# Patient Record
Sex: Male | Born: 1970
Health system: Southern US, Community
[De-identification: ages and names within clinical notes are randomized; demographics above are authoritative.]

## PROBLEM LIST (undated history)

## (undated) DIAGNOSIS — S060XAA Concussion with loss of consciousness status unknown, initial encounter: Secondary | ICD-10-CM

## (undated) DIAGNOSIS — K5190 Ulcerative colitis, unspecified, without complications: Secondary | ICD-10-CM

## (undated) DIAGNOSIS — Z87442 Personal history of urinary calculi: Secondary | ICD-10-CM

## (undated) DIAGNOSIS — L988 Other specified disorders of the skin and subcutaneous tissue: Secondary | ICD-10-CM

## (undated) DIAGNOSIS — R059 Cough, unspecified: Secondary | ICD-10-CM

## (undated) DIAGNOSIS — N390 Urinary tract infection, site not specified: Secondary | ICD-10-CM

## (undated) DIAGNOSIS — K219 Gastro-esophageal reflux disease without esophagitis: Secondary | ICD-10-CM

## (undated) HISTORY — PX: SHOULDER SURGERY: SHX246

## (undated) HISTORY — PX: KNEE SURGERY: SHX244

---

## 1999-06-06 ENCOUNTER — Ambulatory Visit (HOSPITAL_BASED_OUTPATIENT_CLINIC_OR_DEPARTMENT_OTHER): Admission: RE | Admit: 1999-06-06 | Discharge: 1999-06-06 | Payer: Self-pay | Admitting: Orthopedic Surgery

## 2000-07-02 ENCOUNTER — Emergency Department (HOSPITAL_COMMUNITY): Admission: EM | Admit: 2000-07-02 | Discharge: 2000-07-02 | Payer: Self-pay | Admitting: *Deleted

## 2000-07-02 ENCOUNTER — Encounter: Payer: Self-pay | Admitting: *Deleted

## 2002-11-09 ENCOUNTER — Encounter: Payer: Self-pay | Admitting: Family Medicine

## 2002-11-09 ENCOUNTER — Encounter: Admission: RE | Admit: 2002-11-09 | Discharge: 2002-11-09 | Payer: Self-pay | Admitting: Family Medicine

## 2003-09-20 ENCOUNTER — Encounter: Admission: RE | Admit: 2003-09-20 | Discharge: 2003-09-20 | Payer: Self-pay | Admitting: Family Medicine

## 2004-12-11 ENCOUNTER — Emergency Department (HOSPITAL_COMMUNITY): Admission: EM | Admit: 2004-12-11 | Discharge: 2004-12-11 | Payer: Self-pay | Admitting: Emergency Medicine

## 2007-11-15 ENCOUNTER — Encounter: Admission: RE | Admit: 2007-11-15 | Discharge: 2007-11-15 | Payer: Self-pay | Admitting: Family Medicine

## 2008-08-14 ENCOUNTER — Encounter: Admission: RE | Admit: 2008-08-14 | Discharge: 2008-08-14 | Payer: Self-pay | Admitting: Family Medicine

## 2010-02-15 ENCOUNTER — Encounter: Admission: RE | Admit: 2010-02-15 | Discharge: 2010-02-15 | Payer: Self-pay | Admitting: Family Medicine

## 2013-09-01 ENCOUNTER — Encounter (HOSPITAL_BASED_OUTPATIENT_CLINIC_OR_DEPARTMENT_OTHER): Payer: Self-pay | Admitting: Emergency Medicine

## 2013-09-01 ENCOUNTER — Emergency Department (HOSPITAL_BASED_OUTPATIENT_CLINIC_OR_DEPARTMENT_OTHER)
Admission: EM | Admit: 2013-09-01 | Discharge: 2013-09-01 | Disposition: A | Discharge status: Home / Self Care | Payer: 59 | Attending: Emergency Medicine | Admitting: Emergency Medicine

## 2013-09-01 DIAGNOSIS — B9789 Other viral agents as the cause of diseases classified elsewhere: Secondary | ICD-10-CM | POA: Insufficient documentation

## 2013-09-01 DIAGNOSIS — N390 Urinary tract infection, site not specified: Secondary | ICD-10-CM

## 2013-09-01 DIAGNOSIS — B349 Viral infection, unspecified: Secondary | ICD-10-CM

## 2013-09-01 DIAGNOSIS — R5381 Other malaise: Secondary | ICD-10-CM | POA: Insufficient documentation

## 2013-09-01 DIAGNOSIS — R5383 Other fatigue: Secondary | ICD-10-CM

## 2013-09-01 DIAGNOSIS — J3489 Other specified disorders of nose and nasal sinuses: Secondary | ICD-10-CM | POA: Insufficient documentation

## 2013-09-01 LAB — URINE MICROSCOPIC-ADD ON

## 2013-09-01 LAB — URINALYSIS, ROUTINE W REFLEX MICROSCOPIC
Bilirubin Urine: NEGATIVE
GLUCOSE, UA: NEGATIVE mg/dL
HGB URINE DIPSTICK: NEGATIVE
Ketones, ur: 15 mg/dL — AB
Nitrite: NEGATIVE
PH: 6.5 (ref 5.0–8.0)
PROTEIN: NEGATIVE mg/dL
SPECIFIC GRAVITY, URINE: 1.033 — AB (ref 1.005–1.030)
Urobilinogen, UA: 1 mg/dL (ref 0.0–1.0)

## 2013-09-01 MED ORDER — CIPROFLOXACIN HCL 500 MG PO TABS
500.0000 mg | ORAL_TABLET | Freq: Two times a day (BID) | ORAL | Status: DC
Start: 2013-09-01 — End: 2017-11-25

## 2013-09-01 MED ORDER — IBUPROFEN 600 MG PO TABS: 600.0000 mg | ORAL_TABLET | Freq: Four times a day (QID) | ORAL | Status: DC | PRN

## 2013-09-01 NOTE — ED Notes (Signed)
Pt reports fever and back & body aches since Monday- concerned because this is how his prostatitis usually presents vs flu

## 2013-09-01 NOTE — ED Notes (Signed)
Rx x 2 given for cipro and ibuprofen.

## 2013-09-01 NOTE — ED Provider Notes (Signed)
CSN: [id number]     Arrival date & time 09/01/13  0205 History   First MD Initiated Contact with Patient 09/01/13 0255     Chief Complaint  Patient presents with  . Fever   (Consider location/radiation/quality/duration/timing/severity/associated sxs/prior Treatment) Patient is a 43 y.o. male presenting with fever. The history is provided by the patient.  Fever Max temp prior to arrival:  100 Severity:  Moderate Onset quality:  Gradual Duration:  5 days Timing:  Intermittent Progression:  Unchanged Chronicity:  New Relieved by:  Nothing Worsened by:  Nothing tried Associated symptoms: congestion and myalgias   Associated symptoms: no chest pain, no cough and no diarrhea   Risk factors: no hx of cancer   Also feels like he has incomplete emptying of the bladder.  Not painful like prostatis but he is concerned   History reviewed. No pertinent past medical history. Past Surgical History  Procedure Laterality Date  . Knee surgery    . Shoulder surgery     No family history on file. History  Substance Use Topics  . Smoking status: Never Smoker   . Smokeless tobacco: Never Used  . Alcohol Use: No    Review of Systems  Constitutional: Positive for fever.  HENT: Positive for congestion.   Respiratory: Negative for cough.   Cardiovascular: Negative for chest pain.  Gastrointestinal: Negative for diarrhea.  Musculoskeletal: Positive for myalgias.  All other systems reviewed and are negative.    Allergies  Review of patient's allergies indicates no known allergies.  Home Medications   Current Outpatient Rx  Name  Route  Sig  Dispense  Refill  . ibuprofen (ADVIL,MOTRIN) 200 MG tablet   Oral   Take 600 mg by mouth every 6 (six) hours as needed.          BP 138/77  Pulse 90  Temp(Src) 98.1 F (36.7 C) (Oral)  Resp 18  Ht 6\' 3"  (1.905 m)  Wt 230 lb (104.327 kg)  BMI 28.75 kg/m2  SpO2 97% Physical Exam  Constitutional: He is oriented to person, place, and time.  He appears well-developed and well-nourished. No distress.  HENT:  Head: Normocephalic and atraumatic.  Mouth/Throat: Oropharynx is clear and moist. No oropharyngeal exudate.  Eyes: Conjunctivae are normal. Pupils are equal, round, and reactive to light.  Neck: Normal range of motion. Neck supple.  Cardiovascular: Normal rate, regular rhythm and intact distal pulses.   Pulmonary/Chest: Effort normal and breath sounds normal. He has no wheezes. He has no rales.  Abdominal: Soft. Bowel sounds are normal. There is no tenderness. There is no rebound and no guarding.  Musculoskeletal: Normal range of motion.  Lymphadenopathy:    He has no cervical adenopathy.  Neurological: He is alert and oriented to person, place, and time. He has normal reflexes.  Skin: Skin is warm and dry.  Psychiatric: He has a normal mood and affect.    ED Course  Procedures (including critical care time) Labs Review Labs Reviewed  URINALYSIS, ROUTINE W REFLEX MICROSCOPIC - Abnormal; Notable for the following:    Specific Gravity, Urine 1.033 (*)    Ketones, ur 15 (*)    Leukocytes, UA SMALL (*)    All other components within normal limits  URINE MICROSCOPIC-ADD ON - Abnormal; Notable for the following:    Bacteria, UA FEW (*)    All other components within normal limits  URINE CULTURE   Imaging Review No results found.  EKG Interpretation   None  MDM  No diagnosis found. Viral syndrome and will treat for UTI follow up with urology   Malashia Kamaka K Melesa Lecy-Rasch, MD 09/01/13 [phone]

## 2013-09-03 LAB — URINE CULTURE: Colony Count: >=100000

## 2013-09-04 ENCOUNTER — Telehealth (HOSPITAL_COMMUNITY): Payer: Self-pay | Admitting: Emergency Medicine

## 2013-09-04 NOTE — ED Notes (Signed)
Post ED Visit - Positive Culture Follow-up  Culture report reviewed by antimicrobial stewardship pharmacist: [x] Wes Dulaney, Pharm.D., BCPS [] Jeremy Frens, Pharm.D., BCPS [] Elizabeth Martin, Pharm.D., BCPS [] Minh Pham, Pharm.D., BCPS, AAHIVP [] Michelle Turner, Pharm.D., BCPS, AAHIVP  Positive urine culture Treated with Cipro, organism sensitive to the same and no further patient follow-up is required at this time.  Andrew Adkins 09/04/2013, 6:01 PM   

## 2015-09-02 HISTORY — PX: OTHER SURGICAL HISTORY: SHX169

## 2016-09-26 DIAGNOSIS — G4730 Sleep apnea, unspecified: Secondary | ICD-10-CM | POA: Diagnosis not present

## 2016-09-29 DIAGNOSIS — G4733 Obstructive sleep apnea (adult) (pediatric): Secondary | ICD-10-CM | POA: Diagnosis not present

## 2016-12-23 DIAGNOSIS — K625 Hemorrhage of anus and rectum: Secondary | ICD-10-CM | POA: Diagnosis not present

## 2017-01-02 DIAGNOSIS — K5289 Other specified noninfective gastroenteritis and colitis: Secondary | ICD-10-CM | POA: Diagnosis not present

## 2017-01-02 DIAGNOSIS — K529 Noninfective gastroenteritis and colitis, unspecified: Secondary | ICD-10-CM | POA: Diagnosis not present

## 2017-01-02 DIAGNOSIS — K625 Hemorrhage of anus and rectum: Secondary | ICD-10-CM | POA: Diagnosis not present

## 2017-01-09 DIAGNOSIS — R197 Diarrhea, unspecified: Secondary | ICD-10-CM | POA: Diagnosis not present

## 2017-01-28 DIAGNOSIS — K5100 Ulcerative (chronic) pancolitis without complications: Secondary | ICD-10-CM | POA: Diagnosis not present

## 2017-02-26 DIAGNOSIS — K5100 Ulcerative (chronic) pancolitis without complications: Secondary | ICD-10-CM | POA: Diagnosis not present

## 2017-05-13 DIAGNOSIS — Z23 Encounter for immunization: Secondary | ICD-10-CM | POA: Diagnosis not present

## 2017-08-03 DIAGNOSIS — L821 Other seborrheic keratosis: Secondary | ICD-10-CM | POA: Diagnosis not present

## 2017-08-03 DIAGNOSIS — L918 Other hypertrophic disorders of the skin: Secondary | ICD-10-CM | POA: Diagnosis not present

## 2017-08-03 DIAGNOSIS — Z86018 Personal history of other benign neoplasm: Secondary | ICD-10-CM | POA: Diagnosis not present

## 2017-09-07 DIAGNOSIS — K5100 Ulcerative (chronic) pancolitis without complications: Secondary | ICD-10-CM | POA: Diagnosis not present

## 2017-11-23 DIAGNOSIS — R509 Fever, unspecified: Secondary | ICD-10-CM | POA: Diagnosis not present

## 2017-11-24 DIAGNOSIS — N390 Urinary tract infection, site not specified: Secondary | ICD-10-CM | POA: Diagnosis not present

## 2017-11-24 DIAGNOSIS — B9620 Unspecified Escherichia coli [E. coli] as the cause of diseases classified elsewhere: Secondary | ICD-10-CM | POA: Diagnosis not present

## 2017-11-24 DIAGNOSIS — N410 Acute prostatitis: Secondary | ICD-10-CM | POA: Diagnosis not present

## 2017-11-25 ENCOUNTER — Other Ambulatory Visit: Payer: Self-pay

## 2017-11-25 ENCOUNTER — Emergency Department (HOSPITAL_COMMUNITY)
Admission: EM | Admit: 2017-11-25 | Discharge: 2017-11-25 | Disposition: A | Discharge status: Home / Self Care | Payer: 59 | Attending: Physician Assistant | Admitting: Physician Assistant

## 2017-11-25 ENCOUNTER — Encounter (HOSPITAL_COMMUNITY): Payer: Self-pay | Admitting: Emergency Medicine

## 2017-11-25 DIAGNOSIS — R339 Retention of urine, unspecified: Secondary | ICD-10-CM | POA: Diagnosis not present

## 2017-11-25 DIAGNOSIS — Z79899 Other long term (current) drug therapy: Secondary | ICD-10-CM | POA: Insufficient documentation

## 2017-11-25 HISTORY — DX: Ulcerative colitis, unspecified, without complications: K51.90

## 2017-11-25 HISTORY — DX: Urinary tract infection, site not specified: N39.0

## 2017-11-25 LAB — COMPREHENSIVE METABOLIC PANEL
ALK PHOS: 100 U/L (ref 38–126)
ALT: 38 U/L (ref 17–63)
AST: 36 U/L (ref 15–41)
Albumin: 4.3 g/dL (ref 3.5–5.0)
Anion gap: 13 (ref 5–15)
BUN: 19 mg/dL (ref 6–20)
CHLORIDE: 97 mmol/L — AB (ref 101–111)
CO2: 24 mmol/L (ref 22–32)
CREATININE: 1.22 mg/dL (ref 0.61–1.24)
Calcium: 9.3 mg/dL (ref 8.9–10.3)
GFR calc Af Amer: >60 mL/min (ref >60)
Glucose, Bld: 108 mg/dL — ABNORMAL HIGH (ref 65–99)
Potassium: 4 mmol/L (ref 3.5–5.1)
Sodium: 134 mmol/L — ABNORMAL LOW (ref 135–145)
Total Bilirubin: 3.2 mg/dL — ABNORMAL HIGH (ref 0.3–1.2)
Total Protein: 8.3 g/dL — ABNORMAL HIGH (ref 6.5–8.1)

## 2017-11-25 LAB — CBC WITH DIFFERENTIAL/PLATELET
Basophils Absolute: 0 10*3/uL (ref 0.0–0.1)
Basophils Relative: 0 %
EOS ABS: 0 10*3/uL (ref 0.0–0.7)
Eosinophils Relative: 0 %
HCT: 40.8 % (ref 39.0–52.0)
Hemoglobin: 14.2 g/dL (ref 13.0–17.0)
LYMPHS ABS: 1 10*3/uL (ref 0.7–4.0)
Lymphocytes Relative: 5 %
MCH: 30.7 pg (ref 26.0–34.0)
MCHC: 34.8 g/dL (ref 30.0–36.0)
MCV: 88.1 fL (ref 78.0–100.0)
MONOS PCT: 6 %
Monocytes Absolute: 1.2 10*3/uL — ABNORMAL HIGH (ref 0.1–1.0)
Neutro Abs: 17.4 10*3/uL — ABNORMAL HIGH (ref 1.7–7.7)
Neutrophils Relative %: 89 %
PLATELETS: 229 10*3/uL (ref 150–400)
RBC: 4.63 MIL/uL (ref 4.22–5.81)
RDW: 12.2 % (ref 11.5–15.5)
WBC: 19.6 10*3/uL — AB (ref 4.0–10.5)

## 2017-11-25 LAB — URINALYSIS, ROUTINE W REFLEX MICROSCOPIC
Bilirubin Urine: NEGATIVE
GLUCOSE, UA: NEGATIVE mg/dL
Ketones, ur: 20 mg/dL — AB
Nitrite: NEGATIVE
PH: 5 (ref 5.0–8.0)
PROTEIN: 30 mg/dL — AB
SPECIFIC GRAVITY, URINE: 1.028 (ref 1.005–1.030)

## 2017-11-25 NOTE — ED Triage Notes (Signed)
Pt recently diagnosed with UTI; retention throughout the night.

## 2017-11-25 NOTE — ED Provider Notes (Signed)
[facility]-EMERGENCY DEPT Provider Note   CSN: [id number] Arrival date & time: 11/25/17  2956     History   Chief Complaint Chief Complaint  Patient presents with  . Urinary Retention    HPI Andrew Adkins is a 47 y.o. male.  HPI   47 year old male presenting with acute urinary retention.  3 days ago patient had some fevers and chills.  2 days ago seen in his primary care and tested for the flu which is negative.  He then began to have blood in his urine so he made an appointment [facility].  He was seen there yesterday.  Found to have a urinary tract infection, given ceftriaxone and Cipro for home.  Patient had ultrasound of his kidneys at that time showing no evidence of stones.  Last night he has not urinated since 9 PM.  Bladder scan shows over 400.  Patient was acutely uncomfortable on arrival.  Past Medical History:  Diagnosis Date  . Ulcerative colitis (HCC)   . UTI (urinary tract infection)     There are no active problems to display for this patient.   Past Surgical History:  Procedure Laterality Date  . KNEE SURGERY    . SHOULDER SURGERY          Home Medications    Prior to Admission medications   Medication Sig Start Date End Date Taking? Authorizing Provider  acetaminophen (TYLENOL) 500 MG tablet Take 1,000 mg by mouth every 6 (six) hours as needed for mild pain.   Yes [provider]  APRISO 0.375 g 24 hr capsule Take 0.375 g by mouth 3 (three) times daily.  09/03/17  Yes [provider]  ciprofloxacin (CIPRO) 500 MG tablet Take 500 mg by mouth 2 (two) times daily.   Yes [provider]  ibuprofen (ADVIL,MOTRIN) 200 MG tablet Take 400 mg by mouth every 6 (six) hours as needed for moderate pain.    Yes [provider]  oseltamivir (TAMIFLU) 75 MG capsule Take 75 mg by mouth 2 (two) times daily. 11/23/17  Yes [provider]    Family History No family history on file.  Social  History Social History   Tobacco Use  . Smoking status: Never Smoker  . Smokeless tobacco: Never Used  Substance Use Topics  . Alcohol use: No  . Drug use: No     Allergies   Patient has no known allergies.   Review of Systems Review of Systems  Constitutional: Negative for activity change, fatigue and fever.  Respiratory: Negative for shortness of breath.   Cardiovascular: Negative for chest pain.  Gastrointestinal: Negative for abdominal pain.  Genitourinary: Positive for decreased urine volume, difficulty urinating and flank pain.  All other systems reviewed and are negative.    Physical Exam Updated Vital Signs BP 129/79 (BP Location: Left Arm)   Pulse 90   Temp 98.1 F (36.7 C) (Oral)   Resp 20   SpO2 100%   Physical Exam  Constitutional: He is oriented to person, place, and time. He appears well-nourished.  HENT:  Head: Normocephalic.  Eyes: Conjunctivae are normal. Right eye exhibits no discharge. Left eye exhibits no discharge.  Cardiovascular: Normal rate and regular rhythm.  No murmur heard. Pulmonary/Chest: Effort normal and breath sounds normal. No respiratory distress.  Abdominal: Soft. He exhibits no distension. There is no tenderness.  Genitourinary:  Genitourinary Comments: Foley in place  Neurological: He is oriented to person, place, and time.  Skin: Skin  is warm and dry. He is not diaphoretic.  Psychiatric: He has a normal mood and affect. His behavior is normal.     ED Treatments / Results  Labs (all labs ordered are listed, but only abnormal results are displayed) Labs Reviewed  URINE CULTURE  URINALYSIS, ROUTINE W REFLEX MICROSCOPIC  COMPREHENSIVE METABOLIC PANEL  CBC WITH DIFFERENTIAL/PLATELET    EKG None  Radiology No results found.  Procedures Procedures (including critical care time)  Medications Ordered in ED Medications - No data to display   Initial Impression / Assessment and Plan / ED Course  I have reviewed  the triage vital signs and the nursing notes.  Pertinent labs & imaging results that were available during my care of the patient were reviewed by me and considered in my medical decision making (see chart for details).     47 year old male presenting with acute urinary retention.  3 days ago patient had some fevers and chills.  2 days ago seen in his primary care and tested for the flu which is negative.  He then began to have blood in his urine so he made an appointment [facility].  He was seen there yesterday.  Found to have a urinary tract infection, given ceftriaxone and Cipro for home.  Patient had ultrasound of his kidneys at that time showing no evidence of stones.  Last night he has not urinated since 9 PM.  Bladder scan shows over 400.  Patient was acutely uncomfortable on arrival.  8:40 AM Given that she had recently normal ultrasound, no need to repeat.  Will get blood work to make sure patient has normal kidney function.  Patient has normal vital signs.  Appears comfortable now.  We will plan to keep Foley for 7-10 days and follow-up with urology.  Final Clinical Impressions(s) / ED Diagnoses   Final diagnoses:  None    ED Discharge Orders    None       Maxwell Martorano, Cindee Saltourteney Lyn, MD 11/25/17 1551

## 2017-11-25 NOTE — Discharge Instructions (Addendum)
Please continue to take the antibiotics prescribed by your urologist.  Please follow-up with urology if not improving.  We anticipate that you will start feeling better in the next 24 hours.  We have placed a Foley that will require to be there for 7-10 days.  Please use a leg bag, and to follow-up with urology as planned.

## 2017-11-26 LAB — URINE CULTURE: CULTURE: NO GROWTH

## 2017-12-02 DIAGNOSIS — N410 Acute prostatitis: Secondary | ICD-10-CM | POA: Diagnosis not present

## 2017-12-02 DIAGNOSIS — R3915 Urgency of urination: Secondary | ICD-10-CM | POA: Diagnosis not present

## 2018-01-04 DIAGNOSIS — Z125 Encounter for screening for malignant neoplasm of prostate: Secondary | ICD-10-CM | POA: Diagnosis not present

## 2018-01-04 DIAGNOSIS — E782 Mixed hyperlipidemia: Secondary | ICD-10-CM | POA: Diagnosis not present

## 2018-01-04 DIAGNOSIS — Z87440 Personal history of urinary (tract) infections: Secondary | ICD-10-CM | POA: Diagnosis not present

## 2018-01-04 DIAGNOSIS — Z0000 Encounter for general adult medical examination without abnormal findings: Secondary | ICD-10-CM | POA: Diagnosis not present

## 2018-01-04 DIAGNOSIS — K5100 Ulcerative (chronic) pancolitis without complications: Secondary | ICD-10-CM | POA: Diagnosis not present

## 2018-01-04 DIAGNOSIS — K219 Gastro-esophageal reflux disease without esophagitis: Secondary | ICD-10-CM | POA: Diagnosis not present

## 2018-02-02 DIAGNOSIS — R972 Elevated prostate specific antigen [PSA]: Secondary | ICD-10-CM | POA: Diagnosis not present

## 2018-04-19 DIAGNOSIS — J392 Other diseases of pharynx: Secondary | ICD-10-CM | POA: Diagnosis not present

## 2018-04-19 DIAGNOSIS — Z7289 Other problems related to lifestyle: Secondary | ICD-10-CM | POA: Diagnosis not present

## 2018-04-19 DIAGNOSIS — J343 Hypertrophy of nasal turbinates: Secondary | ICD-10-CM | POA: Diagnosis not present

## 2018-04-30 DIAGNOSIS — R972 Elevated prostate specific antigen [PSA]: Secondary | ICD-10-CM | POA: Diagnosis not present

## 2018-06-16 DIAGNOSIS — Z23 Encounter for immunization: Secondary | ICD-10-CM | POA: Diagnosis not present

## 2018-08-10 DIAGNOSIS — R972 Elevated prostate specific antigen [PSA]: Secondary | ICD-10-CM | POA: Diagnosis not present

## 2018-08-23 DIAGNOSIS — R972 Elevated prostate specific antigen [PSA]: Secondary | ICD-10-CM | POA: Diagnosis not present

## 2018-08-30 DIAGNOSIS — R972 Elevated prostate specific antigen [PSA]: Secondary | ICD-10-CM | POA: Diagnosis not present

## 2018-08-30 DIAGNOSIS — N411 Chronic prostatitis: Secondary | ICD-10-CM | POA: Diagnosis not present

## 2018-08-30 DIAGNOSIS — N414 Granulomatous prostatitis: Secondary | ICD-10-CM | POA: Diagnosis not present

## 2018-10-29 DIAGNOSIS — K5100 Ulcerative (chronic) pancolitis without complications: Secondary | ICD-10-CM | POA: Diagnosis not present

## 2019-08-02 DIAGNOSIS — U071 COVID-19: Secondary | ICD-10-CM

## 2019-08-02 HISTORY — DX: COVID-19: U07.1

## 2020-12-21 DIAGNOSIS — K219 Gastro-esophageal reflux disease without esophagitis: Secondary | ICD-10-CM | POA: Diagnosis not present

## 2020-12-21 DIAGNOSIS — K5100 Ulcerative (chronic) pancolitis without complications: Secondary | ICD-10-CM | POA: Diagnosis not present

## 2021-02-07 ENCOUNTER — Ambulatory Visit
Admission: RE | Admit: 2021-02-07 | Discharge: 2021-02-07 | Disposition: A | Discharge status: Home / Self Care | Payer: BC Managed Care – PPO | Source: Ambulatory Visit | Attending: Family Medicine | Admitting: Family Medicine

## 2021-02-07 ENCOUNTER — Other Ambulatory Visit: Payer: Self-pay | Admitting: Family Medicine

## 2021-02-07 DIAGNOSIS — M25522 Pain in left elbow: Secondary | ICD-10-CM | POA: Diagnosis not present

## 2021-02-07 DIAGNOSIS — R030 Elevated blood-pressure reading, without diagnosis of hypertension: Secondary | ICD-10-CM | POA: Diagnosis not present

## 2021-02-07 DIAGNOSIS — M542 Cervicalgia: Secondary | ICD-10-CM

## 2021-04-11 DIAGNOSIS — E782 Mixed hyperlipidemia: Secondary | ICD-10-CM | POA: Diagnosis not present

## 2021-04-11 DIAGNOSIS — Z0000 Encounter for general adult medical examination without abnormal findings: Secondary | ICD-10-CM | POA: Diagnosis not present

## 2021-04-11 DIAGNOSIS — Z23 Encounter for immunization: Secondary | ICD-10-CM | POA: Diagnosis not present

## 2021-04-11 DIAGNOSIS — Z125 Encounter for screening for malignant neoplasm of prostate: Secondary | ICD-10-CM | POA: Diagnosis not present

## 2021-04-16 DIAGNOSIS — D489 Neoplasm of uncertain behavior, unspecified: Secondary | ICD-10-CM | POA: Diagnosis not present

## 2021-04-16 DIAGNOSIS — L821 Other seborrheic keratosis: Secondary | ICD-10-CM | POA: Diagnosis not present

## 2021-04-16 DIAGNOSIS — L820 Inflamed seborrheic keratosis: Secondary | ICD-10-CM | POA: Diagnosis not present

## 2021-04-16 DIAGNOSIS — D2239 Melanocytic nevi of other parts of face: Secondary | ICD-10-CM | POA: Diagnosis not present

## 2021-04-16 DIAGNOSIS — D239 Other benign neoplasm of skin, unspecified: Secondary | ICD-10-CM | POA: Diagnosis not present

## 2021-06-11 DIAGNOSIS — L089 Local infection of the skin and subcutaneous tissue, unspecified: Secondary | ICD-10-CM | POA: Diagnosis not present

## 2021-06-11 DIAGNOSIS — L723 Sebaceous cyst: Secondary | ICD-10-CM | POA: Diagnosis not present

## 2021-07-16 ENCOUNTER — Ambulatory Visit: Payer: Self-pay | Admitting: Surgery

## 2021-07-16 DIAGNOSIS — L988 Other specified disorders of the skin and subcutaneous tissue: Secondary | ICD-10-CM | POA: Diagnosis not present

## 2021-07-20 DIAGNOSIS — J111 Influenza due to unidentified influenza virus with other respiratory manifestations: Secondary | ICD-10-CM

## 2021-07-20 HISTORY — DX: Influenza due to unidentified influenza virus with other respiratory manifestations: J11.1

## 2021-07-23 ENCOUNTER — Encounter (HOSPITAL_BASED_OUTPATIENT_CLINIC_OR_DEPARTMENT_OTHER): Payer: Self-pay | Admitting: Surgery

## 2021-07-23 DIAGNOSIS — Z20828 Contact with and (suspected) exposure to other viral communicable diseases: Secondary | ICD-10-CM | POA: Diagnosis not present

## 2021-07-23 DIAGNOSIS — R509 Fever, unspecified: Secondary | ICD-10-CM | POA: Diagnosis not present

## 2021-07-23 DIAGNOSIS — J324 Chronic pansinusitis: Secondary | ICD-10-CM | POA: Diagnosis not present

## 2021-07-23 DIAGNOSIS — R0981 Nasal congestion: Secondary | ICD-10-CM | POA: Diagnosis not present

## 2021-07-29 ENCOUNTER — Encounter (HOSPITAL_BASED_OUTPATIENT_CLINIC_OR_DEPARTMENT_OTHER): Payer: Self-pay | Admitting: Surgery

## 2021-07-29 ENCOUNTER — Other Ambulatory Visit: Payer: Self-pay

## 2021-07-29 NOTE — Progress Notes (Addendum)
Spoke w/ via phone for pre-op interview---pt Lab needs dos----  none             Lab results------none COVID test -----recent flu still has cough covid test 07-30-2021 or 07-31-2021  Arrive at -------730 am 08-01-2021 NPO after MN NO Solid Food.  Clear liquids from MN until---630 am  Med rec completed Medications to take morning of surgery -----none Diabetic medication -----n/a Patient instructed to bring photo id and insurance card day of surgery Patient aware to have Driver (ride ) / caregiver    for 24 hours after surgery  wife aimee Patient Special Instructions -----none Pre-Op special Istructions -----none Patient verbalized understanding of instructions that were given at this phone interview. Patient denies shortness of breath, chest pain, fever, cough at this phone interview.   Addendum: spoke with patient by phone and he stated he is feeling much better and cough is much improved, he only coughs if he talks a lot  and it is only occasional clear mucous with cough and he wants to have anesthesia assess him in am if covid test he took this am is negative.  Spoke with dr Jacquenette Shone and pt needs to be rescheduled due to recent viral illness and still has cough, called pt and made aware to reschedule 4 to 6 weeks out per dr Jacquenette Shone anesthesia.

## 2021-07-31 ENCOUNTER — Other Ambulatory Visit: Payer: Self-pay | Admitting: Surgery

## 2021-07-31 ENCOUNTER — Encounter (HOSPITAL_BASED_OUTPATIENT_CLINIC_OR_DEPARTMENT_OTHER): Payer: Self-pay | Admitting: Anesthesiology

## 2021-07-31 ENCOUNTER — Encounter (HOSPITAL_BASED_OUTPATIENT_CLINIC_OR_DEPARTMENT_OTHER): Payer: Self-pay | Admitting: Surgery

## 2021-07-31 LAB — SARS CORONAVIRUS 2 (TAT 6-24 HRS): SARS Coronavirus 2: NEGATIVE

## 2021-07-31 NOTE — Anesthesia Preprocedure Evaluation (Deleted)
Anesthesia Evaluation    Reviewed: Allergy & Precautions, Patient's Chart, lab work & pertinent test results  Airway        Dental   Pulmonary  07/20/21 flu + s/p steroids and z pack          Cardiovascular negative cardio ROS       Neuro/Psych negative neurological ROS     GI/Hepatic Neg liver ROS, PUD, UC Pilonidal disease   Endo/Other  negative endocrine ROS  Renal/GU negative Renal ROS     Musculoskeletal negative musculoskeletal ROS (+)   Abdominal   Peds  Hematology negative hematology ROS (+)   Anesthesia Other Findings   Reproductive/Obstetrics                             Anesthesia Physical Anesthesia Plan Anesthesia Quick Evaluation

## 2021-08-01 ENCOUNTER — Ambulatory Visit (HOSPITAL_BASED_OUTPATIENT_CLINIC_OR_DEPARTMENT_OTHER): Admission: RE | Admit: 2021-08-01 | Payer: BC Managed Care – PPO | Source: Home / Self Care | Admitting: Surgery

## 2021-08-01 HISTORY — DX: Concussion with loss of consciousness status unknown, initial encounter: S06.0XAA

## 2021-08-01 HISTORY — DX: Cough, unspecified: R05.9

## 2021-08-01 HISTORY — DX: Personal history of urinary calculi: Z87.442

## 2021-08-01 HISTORY — DX: Gastro-esophageal reflux disease without esophagitis: K21.9

## 2021-08-01 HISTORY — DX: Other specified disorders of the skin and subcutaneous tissue: L98.8

## 2021-08-01 SURGERY — EXCISION, SIMPLE PILONIDAL CYST
Anesthesia: General

## 2021-08-20 DIAGNOSIS — L0591 Pilonidal cyst without abscess: Secondary | ICD-10-CM | POA: Diagnosis not present

## 2021-11-12 DIAGNOSIS — J392 Other diseases of pharynx: Secondary | ICD-10-CM | POA: Diagnosis not present

## 2022-01-28 DIAGNOSIS — K219 Gastro-esophageal reflux disease without esophagitis: Secondary | ICD-10-CM | POA: Diagnosis not present

## 2022-01-28 DIAGNOSIS — J343 Hypertrophy of nasal turbinates: Secondary | ICD-10-CM | POA: Diagnosis not present

## 2022-01-28 DIAGNOSIS — Z86018 Personal history of other benign neoplasm: Secondary | ICD-10-CM | POA: Diagnosis not present

## 2022-01-28 DIAGNOSIS — J3489 Other specified disorders of nose and nasal sinuses: Secondary | ICD-10-CM | POA: Diagnosis not present

## 2022-04-17 DIAGNOSIS — E782 Mixed hyperlipidemia: Secondary | ICD-10-CM | POA: Diagnosis not present

## 2022-04-17 DIAGNOSIS — Z125 Encounter for screening for malignant neoplasm of prostate: Secondary | ICD-10-CM | POA: Diagnosis not present

## 2022-04-17 DIAGNOSIS — Z0000 Encounter for general adult medical examination without abnormal findings: Secondary | ICD-10-CM | POA: Diagnosis not present

## 2022-04-24 DIAGNOSIS — H93291 Other abnormal auditory perceptions, right ear: Secondary | ICD-10-CM | POA: Diagnosis not present

## 2022-04-24 DIAGNOSIS — K219 Gastro-esophageal reflux disease without esophagitis: Secondary | ICD-10-CM | POA: Diagnosis not present

## 2022-04-24 DIAGNOSIS — H9311 Tinnitus, right ear: Secondary | ICD-10-CM | POA: Diagnosis not present

## 2022-09-30 DIAGNOSIS — K5289 Other specified noninfective gastroenteritis and colitis: Secondary | ICD-10-CM | POA: Diagnosis not present

## 2022-09-30 DIAGNOSIS — Z8719 Personal history of other diseases of the digestive system: Secondary | ICD-10-CM | POA: Diagnosis not present

## 2022-09-30 DIAGNOSIS — K648 Other hemorrhoids: Secondary | ICD-10-CM | POA: Diagnosis not present

## 2022-09-30 DIAGNOSIS — D123 Benign neoplasm of transverse colon: Secondary | ICD-10-CM | POA: Diagnosis not present

## 2022-09-30 DIAGNOSIS — K5100 Ulcerative (chronic) pancolitis without complications: Secondary | ICD-10-CM | POA: Diagnosis not present

## 2022-12-27 IMAGING — CR DG CERVICAL SPINE 2 OR 3 VIEWS
4 series · 4 of 4 positions shown · non-contrast
Comparison: None

CLINICAL DATA: Neck discomfort, RIGHT lower neck pain, no injury

EXAM:
CERVICAL SPINE - 2-3 VIEW

[w cervical spine lat]
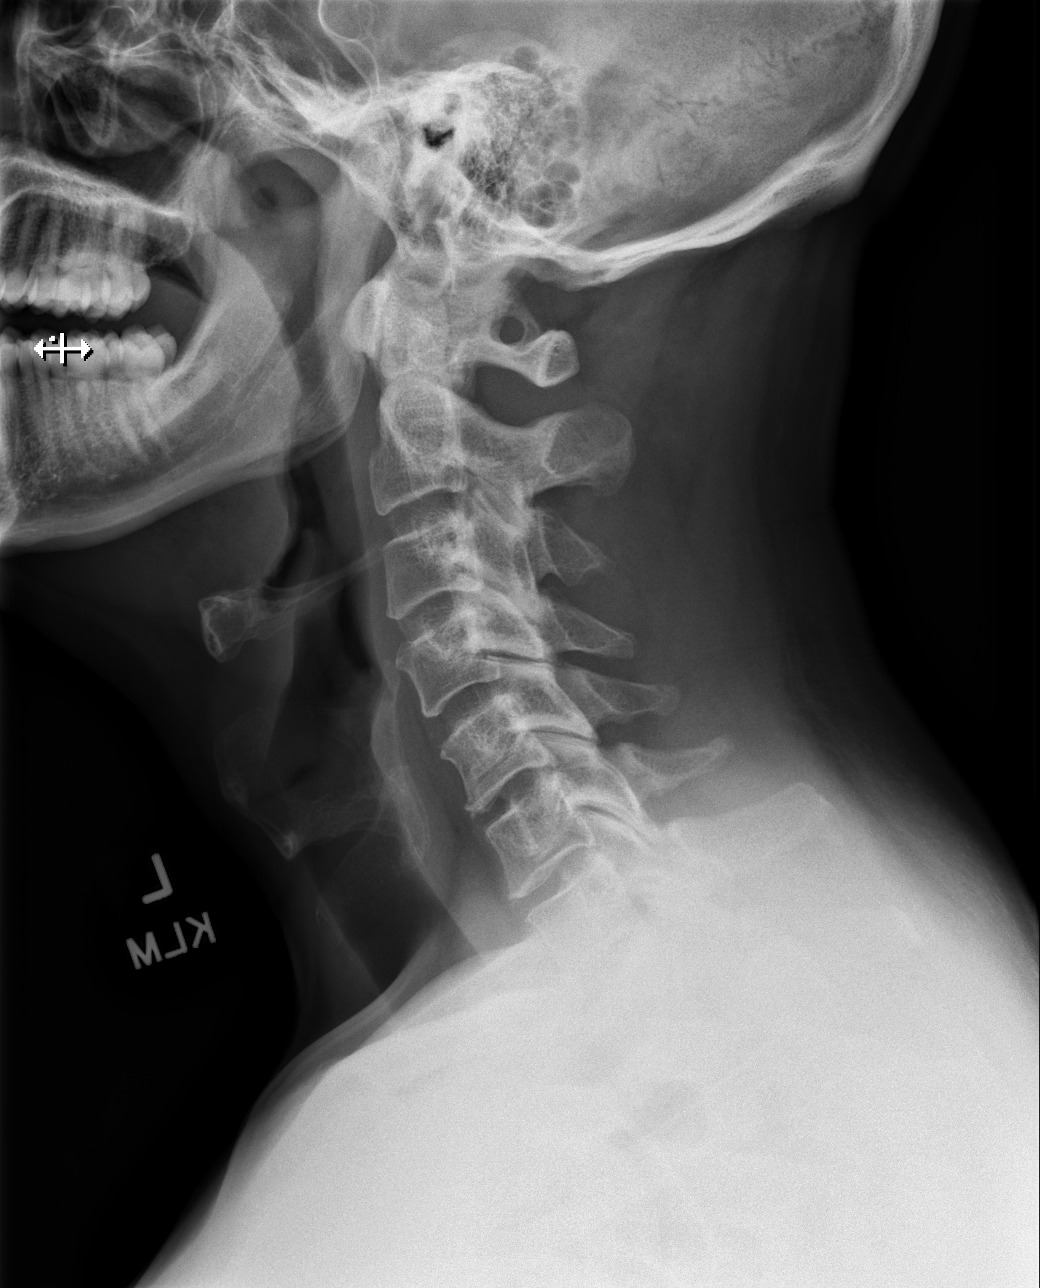

[w cervical swimmers]
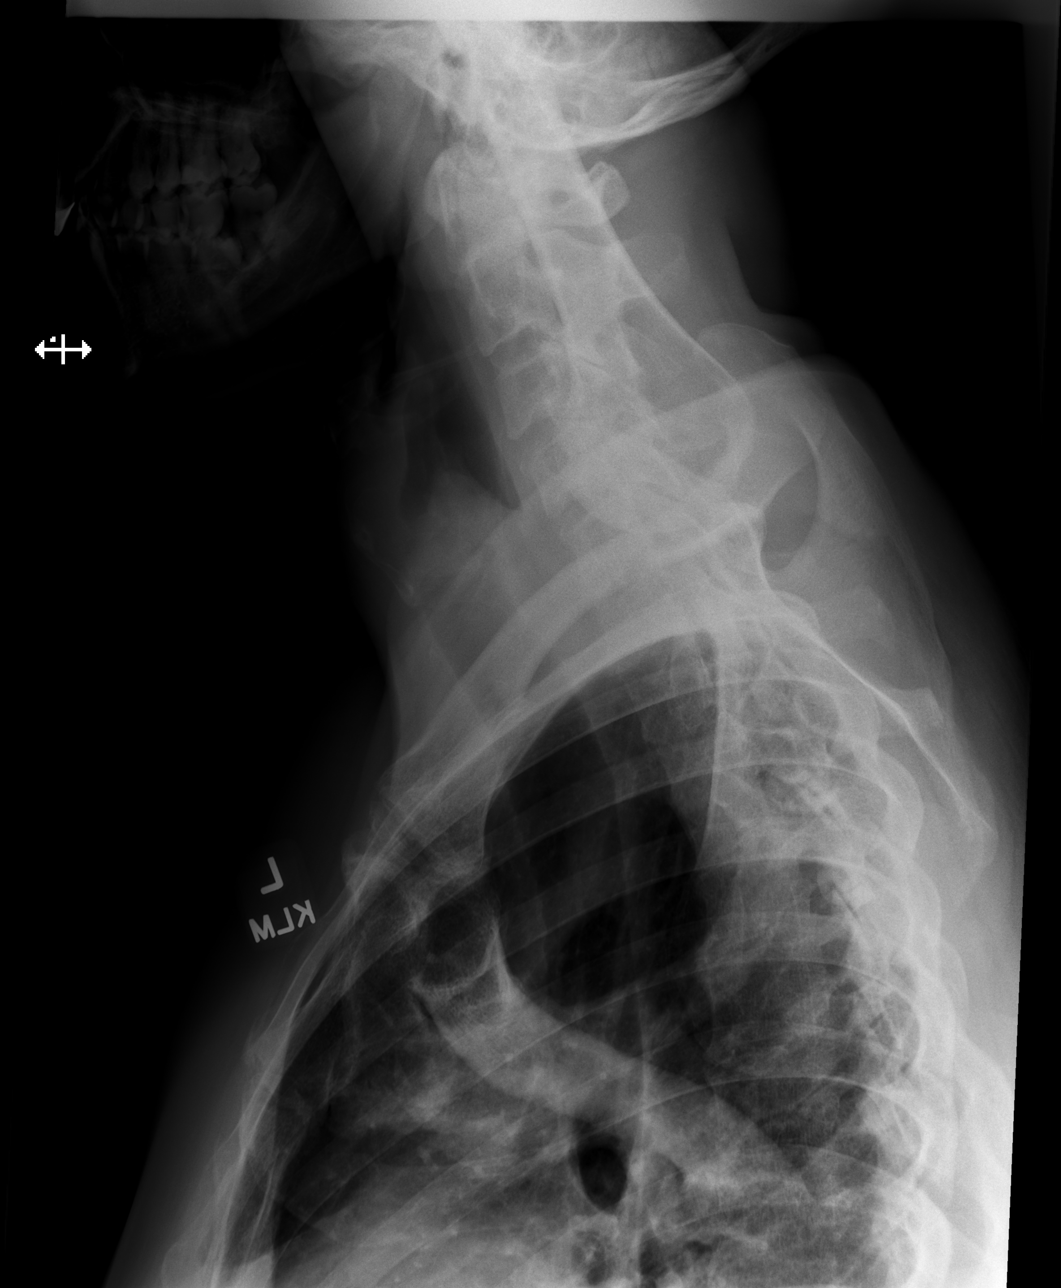

[w cervical spine ap]
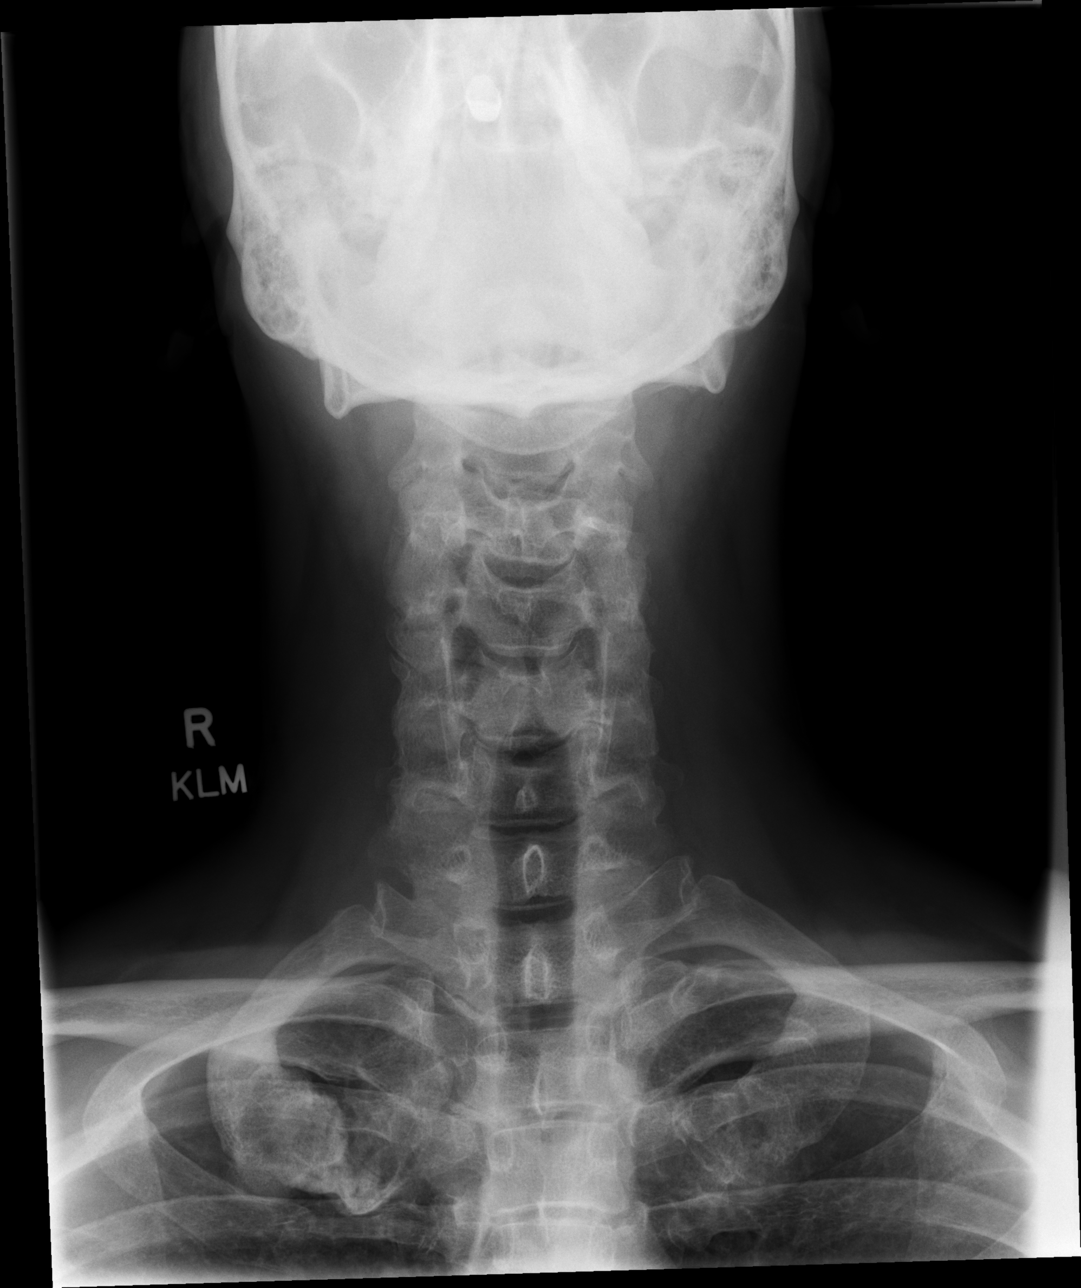

[w cervical spine odontoid]
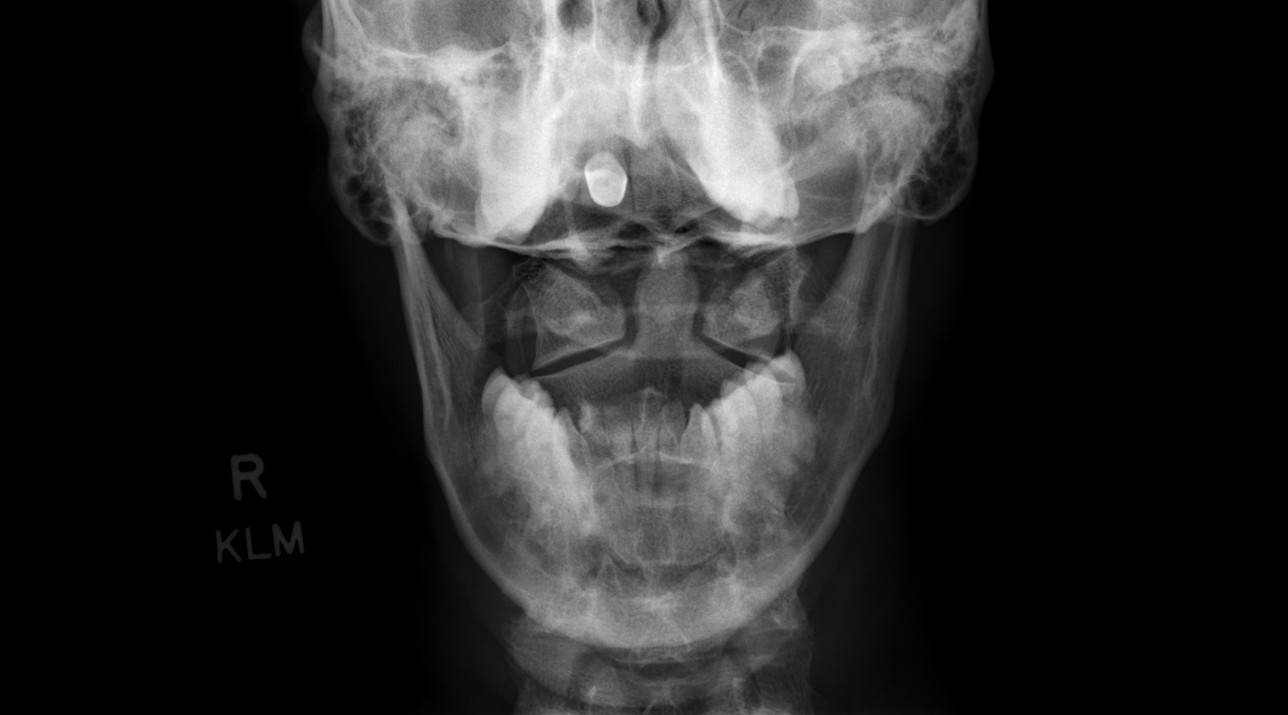

[4 of 4 positions shown; findings below may reference images not displayed]

FINDINGS: Prevertebral soft tissues normal thickness.

Osseous mineralization normal.

Vertebral body and disc space heights maintained.

No fracture, subluxation, or bone destruction.

Tips of lung apices clear.
IMPRESSION: Normal exam.

## 2023-04-28 DIAGNOSIS — H6991 Unspecified Eustachian tube disorder, right ear: Secondary | ICD-10-CM | POA: Diagnosis not present

## 2023-04-28 DIAGNOSIS — H938X1 Other specified disorders of right ear: Secondary | ICD-10-CM | POA: Diagnosis not present

## 2023-04-28 DIAGNOSIS — H6122 Impacted cerumen, left ear: Secondary | ICD-10-CM | POA: Diagnosis not present

## 2023-05-22 DIAGNOSIS — E782 Mixed hyperlipidemia: Secondary | ICD-10-CM | POA: Diagnosis not present

## 2023-05-22 DIAGNOSIS — Z0000 Encounter for general adult medical examination without abnormal findings: Secondary | ICD-10-CM | POA: Diagnosis not present

## 2023-05-22 DIAGNOSIS — Z125 Encounter for screening for malignant neoplasm of prostate: Secondary | ICD-10-CM | POA: Diagnosis not present

## 2023-05-22 DIAGNOSIS — Z23 Encounter for immunization: Secondary | ICD-10-CM | POA: Diagnosis not present

## 2023-06-02 ENCOUNTER — Ambulatory Visit
Admission: RE | Admit: 2023-06-02 | Discharge: 2023-06-02 | Disposition: A | Discharge status: Home / Self Care | Payer: BC Managed Care – PPO | Source: Ambulatory Visit | Attending: Family Medicine | Admitting: Family Medicine

## 2023-06-02 ENCOUNTER — Other Ambulatory Visit: Payer: Self-pay | Admitting: Family Medicine

## 2023-06-02 DIAGNOSIS — R519 Headache, unspecified: Secondary | ICD-10-CM | POA: Diagnosis not present

## 2023-06-02 DIAGNOSIS — R030 Elevated blood-pressure reading, without diagnosis of hypertension: Secondary | ICD-10-CM | POA: Diagnosis not present

## 2023-08-07 DIAGNOSIS — K5190 Ulcerative colitis, unspecified, without complications: Secondary | ICD-10-CM | POA: Diagnosis not present

## 2023-09-15 ENCOUNTER — Encounter: Payer: Self-pay | Admitting: *Deleted

## 2023-09-16 ENCOUNTER — Ambulatory Visit: Payer: BC Managed Care – PPO | Admitting: Diagnostic Neuroimaging

## 2024-07-08 DIAGNOSIS — Z0000 Encounter for general adult medical examination without abnormal findings: Secondary | ICD-10-CM | POA: Diagnosis not present

## 2024-07-08 DIAGNOSIS — E782 Mixed hyperlipidemia: Secondary | ICD-10-CM | POA: Diagnosis not present

## 2024-07-08 DIAGNOSIS — Z125 Encounter for screening for malignant neoplasm of prostate: Secondary | ICD-10-CM | POA: Diagnosis not present

## 2024-09-16 ENCOUNTER — Other Ambulatory Visit (HOSPITAL_COMMUNITY): Payer: Self-pay | Admitting: Family Medicine

## 2024-09-16 DIAGNOSIS — E782 Mixed hyperlipidemia: Secondary | ICD-10-CM

## 2024-10-03 ENCOUNTER — Other Ambulatory Visit (HOSPITAL_BASED_OUTPATIENT_CLINIC_OR_DEPARTMENT_OTHER)

## 2024-10-03 ENCOUNTER — Encounter (HOSPITAL_BASED_OUTPATIENT_CLINIC_OR_DEPARTMENT_OTHER): Payer: Self-pay

## 2024-10-12 ENCOUNTER — Other Ambulatory Visit (HOSPITAL_BASED_OUTPATIENT_CLINIC_OR_DEPARTMENT_OTHER): Payer: Self-pay
